# Patient Record
Sex: Female | Born: 1960 | Race: White | Hispanic: No | Marital: Married | Smoking: Never smoker
Health system: Southern US, Community
[De-identification: ages and names within clinical notes are randomized; demographics above are authoritative.]

## PROBLEM LIST (undated history)

## (undated) DIAGNOSIS — F329 Major depressive disorder, single episode, unspecified: Secondary | ICD-10-CM

## (undated) DIAGNOSIS — F32A Depression, unspecified: Secondary | ICD-10-CM

## (undated) HISTORY — PX: ABDOMINAL HYSTERECTOMY: SHX81

## (undated) HISTORY — PX: OTHER SURGICAL HISTORY: SHX169

## (undated) HISTORY — PX: BUNIONECTOMY: SHX129

## (undated) HISTORY — PX: BREAST REDUCTION SURGERY: SHX8

---

## 2011-02-21 ENCOUNTER — Emergency Department
Admit: 2011-02-21 | Discharge: 2011-02-21 | Disposition: A | Discharge status: Home / Self Care | Payer: Managed Care, Other (non HMO)

## 2011-02-21 ENCOUNTER — Emergency Department
Admission: EM | Admit: 2011-02-21 | Discharge: 2011-02-21 | Disposition: A | Discharge status: Home / Self Care | Payer: BC Managed Care – PPO | Source: Home / Self Care | Attending: Family Medicine | Admitting: Family Medicine

## 2011-02-21 ENCOUNTER — Encounter: Payer: Self-pay | Admitting: Emergency Medicine

## 2011-02-21 DIAGNOSIS — M79609 Pain in unspecified limb: Secondary | ICD-10-CM

## 2011-02-21 DIAGNOSIS — M79642 Pain in left hand: Secondary | ICD-10-CM

## 2011-02-21 DIAGNOSIS — M654 Radial styloid tenosynovitis [de Quervain]: Secondary | ICD-10-CM

## 2011-02-21 HISTORY — DX: Major depressive disorder, single episode, unspecified: F32.9

## 2011-02-21 HISTORY — DX: Depression, unspecified: F32.A

## 2011-02-21 MED ORDER — HYDROCODONE-ACETAMINOPHEN 5-500 MG PO TABS: 1.0000 | ORAL_TABLET | Freq: Every evening | ORAL | Status: AC | PRN

## 2011-02-21 NOTE — ED Notes (Signed)
Pain and swelling in left wrist x 4 days; saw Chiropractor that evening who did treatments and recommended exercises and ice.  Pt has computer related job; repetitive motion.

## 2011-02-24 NOTE — ED Provider Notes (Signed)
History     CSN: [id number] Arrival date & time: 02/21/2011  1:54 PM   First MD Initiated Contact with Patient 02/21/11 1417      Chief Complaint  Patient presents with  . Joint Swelling     HPI Comments: Patient believes that she "slept wrong" on her left hand/wrist 5 days ago, and has had persistent pain in the left wrist and thumb.  She underwent a chiropractic treatment without improvement.  She denies performing repetitive activities and no recent trauma to her left hand/wrist.  Patient is a 50 y.o. female presenting with wrist pain. The history is provided by the patient.  Wrist Pain This is a new problem. The current episode started more than 2 days ago. The problem occurs constantly. The problem has been gradually worsening. Exacerbated by: Moving wrist. The symptoms are relieved by NSAIDs. Treatments tried: Chiropractic. The treatment provided no relief.    Past Medical History  Diagnosis Date  . Depression   . Asthma     Past Surgical History  Procedure Date  . Caesarean   . Abdominal hysterectomy   . Breast reduction surgery     History reviewed. No pertinent family history.  History  Substance Use Topics  . Smoking status: No  . Smokeless tobacco: Not on file  . Alcohol Use: Yes    OB History    Grav Para Term Preterm Abortions TAB SAB Ect Mult Living                  Review of Systems  Constitutional: Negative.   HENT: Negative.   Eyes: Negative.   Respiratory: Negative.   Cardiovascular: Negative.   Gastrointestinal: Negative.   Genitourinary: Negative.   Musculoskeletal: Negative for joint swelling.       Pain in left wrist and thumb  Skin: Negative.   Neurological: Negative for numbness.    Allergies  Review of patient's allergies indicates no known allergies.  Home Medications   Current Outpatient Rx  Name Route Sig Dispense Refill  . ALBUTEROL SULFATE HFA 108 (90 BASE) MCG/ACT IN AERS Inhalation Inhale 2 puffs into the lungs  every 6 (six) hours as needed.      . BUPROPION HCL ER (SR) 150 MG PO TB12 Oral Take 150 mg by mouth 2 (two) times daily.      Marland Kitchen HYDROCODONE-ACETAMINOPHEN 5-500 MG PO TABS Oral Take 1 tablet by mouth at bedtime as needed for pain. 10 tablet 0    Pulse 75  Temp(Src) 97.6 F (36.4 C) (Oral)  Resp 16  Ht 5\' 1"  (1.549 m)  Wt 174 lb (78.926 kg)  BMI 32.88 kg/m2  SpO2 99%  Physical Exam  Constitutional: She is oriented to person, place, and time. She appears well-developed and well-nourished. No distress.  HENT:  Head: Normocephalic.  Nose: Nose normal.  Mouth/Throat: Oropharynx is clear and moist.  Eyes: Conjunctivae are normal. Pupils are equal, round, and reactive to light.  Musculoskeletal:       Left wrist: She exhibits normal range of motion, no tenderness, no bony tenderness, no swelling, no effusion, no crepitus and no deformity.       There is distinct tenderness over extensor tendons of left thumb, especially with resisted abduction of thumb.  Mild thumb swelling present.  Distal Neurovascular function is intact.   Neurological: She is alert and oriented to person, place, and time.  Skin: Skin is warm and dry.    ED Course  Procedures  none  Labs  Reviewed: *RADIOLOGY REPORT*  Clinical Data: Pain at base of thumb. No known injury.  LEFT HAND - COMPLETE 3+ VIEW  Comparison: None  Findings: Small degenerative subcortical cyst or geode noted  distally in the scaphoid. No malalignment or significant  degenerative arthropathy at the first carpometacarpal articulation  noted. No significant osseous abnormality is observed account the  patient's pain at the base of the thumb.  IMPRESSION:  1. A cause for the patient's thumb base pain is not observed. If  symptoms persist despite conservative therapy, MRI followup may be  warranted.  Original Report Authenticated By: Dellia Cloud, M.D.    1. De Quervain's tenosynovitis       MDM  Thumb spica splint applied;  wear for about one week. Apply ice pack for 30 to 45 minutes every 1 to 4 hours.  Continue until swelling decreases.  Begin Ibuprofen 200mg , 4 tabs every 8 hours with food.  Vicodin at bedtime for night-time pain Begin range of motion exercises in about 5 days ([facility] information and instruction handout given)  Followup with [facility] if not improving about two weeks.         Donna Christen, MD 02/24/11 [phone]

## 2011-09-05 ENCOUNTER — Emergency Department (INDEPENDENT_AMBULATORY_CARE_PROVIDER_SITE_OTHER)
Admission: EM | Admit: 2011-09-05 | Discharge: 2011-09-05 | Disposition: A | Discharge status: Home / Self Care | Payer: BC Managed Care – PPO | Source: Home / Self Care | Attending: Emergency Medicine | Admitting: Emergency Medicine

## 2011-09-05 ENCOUNTER — Ambulatory Visit: Payer: BC Managed Care – PPO | Attending: Emergency Medicine | Admitting: Physical Therapy

## 2011-09-05 ENCOUNTER — Emergency Department
Admit: 2011-09-05 | Discharge: 2011-09-05 | Disposition: A | Discharge status: Home / Self Care | Payer: BC Managed Care – PPO

## 2011-09-05 ENCOUNTER — Encounter: Payer: Self-pay | Admitting: *Deleted

## 2011-09-05 DIAGNOSIS — IMO0001 Reserved for inherently not codable concepts without codable children: Secondary | ICD-10-CM | POA: Insufficient documentation

## 2011-09-05 DIAGNOSIS — M545 Low back pain, unspecified: Secondary | ICD-10-CM | POA: Insufficient documentation

## 2011-09-05 DIAGNOSIS — M25559 Pain in unspecified hip: Secondary | ICD-10-CM

## 2011-09-05 MED ORDER — MELOXICAM 7.5 MG PO TABS: 7.5000 mg | ORAL_TABLET | Freq: Two times a day (BID) | ORAL | Status: AC | PRN

## 2011-09-05 NOTE — ED Provider Notes (Signed)
History     CSN: [id number]  Arrival date & time 09/05/11  4098   First MD Initiated Contact with Patient 09/05/11 [phone]      Chief Complaint  Patient presents with  . Hip Pain    (Consider location/radiation/quality/duration/timing/severity/associated sxs/prior treatment) HPI This is a 51 year old white female who complains of bilateral hip pain for the last 3-4 months.  She states that it is in her buttock and lower back area.  She has been to see a chiropractor who has not been x-rayed but has manually adjusted her which does help for a few days and then the pain returns.  She has an appointment with her PCP next week but does not want to wait until then.  She describes the pain as a 2/10 constant ache.  She has not been using any medications or modalities.  She has not been to a physical therapist or an orthopedist for this. Trauma: no Bladder/bowel incontinence: no Weakness: no Fever/chills: no Night pain: no Unexplained weight loss: no Cancer/immunosuppression: no PMH of osteoporosis or chronic steroid use:  no   Past Medical History  Diagnosis Date  . Depression   . Asthma     Past Surgical History  Procedure Date  . Caesarean   . Abdominal hysterectomy   . Breast reduction surgery     History reviewed. No pertinent family history.  History  Substance Use Topics  . Smoking status: Never Smoker   . Smokeless tobacco: Not on file  . Alcohol Use: Yes    OB History    Grav Para Term Preterm Abortions TAB SAB Ect Mult Living                  Review of Systems  All other systems reviewed and are negative.    Allergies  Review of patient's allergies indicates no known allergies.  Home Medications   Current Outpatient Rx  Name Route Sig Dispense Refill  . ALBUTEROL SULFATE HFA 108 (90 BASE) MCG/ACT IN AERS Inhalation Inhale 2 puffs into the lungs every 6 (six) hours as needed.      . BUPROPION HCL ER (SR) 150 MG PO TB12 Oral Take 150 mg by mouth 2 (two)  times daily.      . MELOXICAM 7.5 MG PO TABS Oral Take 1 tablet (7.5 mg total) by mouth 2 (two) times daily as needed for pain. 30 tablet 0    BP 135/83  Pulse 82  Resp 16  Ht 5' 0.5" (1.537 m)  Wt 171 lb (77.565 kg)  BMI 32.85 kg/m2  SpO2 98%  Physical Exam  Nursing note and vitals reviewed. Constitutional: She is oriented to person, place, and time. She appears well-developed and well-nourished.  HENT:  Head: Normocephalic and atraumatic.  Eyes: No scleral icterus.  Neck: Neck supple.  Cardiovascular: Regular rhythm and normal heart sounds.   Pulmonary/Chest: Effort normal and breath sounds normal. No respiratory distress.  Musculoskeletal:       Hip and examination demonstrates full range of motion which is pain free.  She is no anterior tenderness, no lateral tenderness.  She does have some tenderness over the bilateral SI joints and paraspinal lower lumbar.  Straight leg raise is negative bilaterally.  No rash or swelling is seen.  Neurological: She is alert and oriented to person, place, and time.  Skin: Skin is warm and dry.  Psychiatric: She has a normal mood and affect. Her speech is normal.    ED Course  Procedures (  including critical care time)  Labs Reviewed - No data to display Dg Lumbar Spine 2-3 Views  09/05/2011  *RADIOLOGY REPORT*  Clinical Data: Low back and SI joint pain for 3 months, radiculopathy on the left  LUMBAR SPINE - 2-3 VIEW  Comparison: None.  Findings: The lumbar vertebrae are in normal alignment.  Only the L5-L1 disc space is slightly narrowed.  However, there does appear to be sclerosis along the facet joints of L3-4, L4-5, and L5-S1 most consistent with facet joint arthropathy.  The SI joints appear corticated.  No compression deformity is seen.  IMPRESSION:  1.  Normal alignment with only minimally decreased disc space at L5- L1. 2.  Facet joint arthropathy at L3-4, L4-5, and L5-S1.  Original Report Authenticated By: Juline Patch, M.D.     1.  Pain in lower back       MDM   She appears to have SI joint dysfunction and pain.  Because she has not had an x-ray and because this has been going on for 3-4 months, I did obtain an x-ray of her lower back which is read by the radiologist as above.  We agreed that we will start her on meloxicam daily and send her to a physical therapist who can further evaluate and treat her pain.  If she is not improving, then a visit to an orthopedist or sports medicine physician would be appropriate at that time.     Marlaine Hind, MD 09/05/11 [phone]

## 2011-09-05 NOTE — ED Notes (Signed)
Patient c/o bilateral hip pain x 3-4 months. She has been going to a chiropractor and using heat which has helped. Recently it has become worse. She has an appointment with her PCP next week but would like some relief for the weekend.

## 2014-11-08 ENCOUNTER — Emergency Department (INDEPENDENT_AMBULATORY_CARE_PROVIDER_SITE_OTHER)
Admission: EM | Admit: 2014-11-08 | Discharge: 2014-11-08 | Disposition: A | Discharge status: Home / Self Care | Payer: BLUE CROSS/BLUE SHIELD | Source: Home / Self Care | Attending: Family Medicine | Admitting: Family Medicine

## 2014-11-08 ENCOUNTER — Emergency Department (INDEPENDENT_AMBULATORY_CARE_PROVIDER_SITE_OTHER): Payer: BLUE CROSS/BLUE SHIELD

## 2014-11-08 ENCOUNTER — Encounter: Payer: Self-pay | Admitting: Emergency Medicine

## 2014-11-08 DIAGNOSIS — M7150 Other bursitis, not elsewhere classified, unspecified site: Secondary | ICD-10-CM | POA: Diagnosis not present

## 2014-11-08 DIAGNOSIS — M7050 Other bursitis of knee, unspecified knee: Secondary | ICD-10-CM

## 2014-11-08 DIAGNOSIS — M1711 Unilateral primary osteoarthritis, right knee: Secondary | ICD-10-CM

## 2014-11-08 MED ORDER — MELOXICAM 15 MG PO TABS: 15.0000 mg | ORAL_TABLET | Freq: Every day | ORAL | Status: DC

## 2014-11-08 NOTE — ED Provider Notes (Signed)
CSN: [id number]     Arrival date & time 11/08/14  1324 History   First MD Initiated Contact with Patient 11/08/14 [phone]     Chief Complaint  Patient presents with  . Knee Pain      HPI Comments: Patient was moving and lifting/carrying boxes up and down stairs four days ago.  She subsequently developed pain in her right knee, and over the past two days has had swelling in her right knee and lower leg.  Patient is a 54 y.o. female presenting with knee pain. The history is provided by the patient.  Knee Pain Location:  Knee Time since incident:  3 days Injury: no   Knee location:  R knee Chronicity:  New Prior injury to area:  No Relieved by:  NSAIDs Worsened by:  Bearing weight and flexion Ineffective treatments:  None tried Associated symptoms: decreased ROM, stiffness and swelling   Associated symptoms: no fever, no muscle weakness, no numbness and no tingling   Risk factors: obesity     Past Medical History  Diagnosis Date  . Depression   . Asthma    Past Surgical History  Procedure Laterality Date  . Caesarean    . Abdominal hysterectomy    . Breast reduction surgery    . Bunionectomy     Family History  Problem Relation Age of Onset  . Diabetes Mother    Social History  Substance Use Topics  . Smoking status: Never Smoker   . Smokeless tobacco: None  . Alcohol Use: Yes   OB History    No data available     Review of Systems  Constitutional: Negative for fever.  Musculoskeletal: Positive for stiffness.  All other systems reviewed and are negative.   Allergies  Review of patient's allergies indicates not on file.  Home Medications   Prior to Admission medications   Medication Sig Start Date End Date Taking? Authorizing Provider  albuterol (PROVENTIL HFA;VENTOLIN HFA) 108 (90 BASE) MCG/ACT inhaler Inhale 2 puffs into the lungs every 6 (six) hours as needed.      Historical Provider, MD  buPROPion (WELLBUTRIN SR) 150 MG 12 hr tablet Take 150 mg by mouth  2 (two) times daily.      Historical Provider, MD  meloxicam (MOBIC) 15 MG tablet Take 1 tablet (15 mg total) by mouth daily. Take with food each morning 11/08/14   Lattie Haw, MD   BP 110/75 mmHg  Pulse 76  Ht 5' (1.524 m)  Wt 181 lb (82.101 kg)  BMI 35.35 kg/m2  SpO2 97% Physical Exam  Constitutional: She is oriented to person, place, and time. She appears well-developed and well-nourished. No distress.  Patient is obese (BMI 35.4)  HENT:  Head: Normocephalic.  Eyes: Pupils are equal, round, and reactive to light.  Pulmonary/Chest: No respiratory distress.  Musculoskeletal:       Right knee: She exhibits decreased range of motion and bony tenderness. She exhibits no swelling, no ecchymosis, no deformity, no erythema, normal alignment, no LCL laxity, normal patellar mobility, normal meniscus and no MCL laxity. Tenderness found. No medial joint line, no lateral joint line, no MCL, no LCL and no patellar tendon tenderness noted.       Legs: There is distinct tenderness to palpation over the pes anserine bursa.  McMurray test negative. Mild tenderness to palpation over posterior calf, without swelling or warmth.  Homan's test negative.  Neurological: She is alert and oriented to person, place, and time.  Skin: Skin  is warm and dry.  Nursing note and vitals reviewed.   ED Course  Procedures  none  Imaging Review Dg Knee Complete 4 Views Right  11/08/2014   CLINICAL DATA:  Right posterior knee pain for 3 days. Injury going up stairs.  EXAM: RIGHT KNEE - COMPLETE 4+ VIEW  COMPARISON:  None.  FINDINGS: No joint effusion identified. There is no fracture or subluxation identified. Patellofemoral and medial compartment marginal spur formation noted.  IMPRESSION: 1. Mild osteoarthritis. 2. No acute findings.   Electronically Signed   By: Signa Kell M.D.   On: 11/08/2014 09:28     MDM   1. Pes anserine bursitis     Hinged knee brace dispensed.  Rx for Mobic  daily. Apply ice  pack for 20 to 30 minutes, 3 to 4 times daily  Continue until pain decreases.  Wear knee brace and lower leg support hose daytime.  Elevate leg when possible.  Begin range of motion and stretching exercises. If symptoms become significantly worse during the night or over the weekend, proceed to the local emergency room.  Followup with Dr. Rodney Langton or Dr. Clementeen Graham ([facility]) if not improving about two weeks.     Lattie Haw, MD 11/08/14 [phone]

## 2014-11-08 NOTE — ED Notes (Signed)
Right knee pain, swelling and right ankle swelling x 4 days

## 2014-11-08 NOTE — Discharge Instructions (Signed)
Apply ice pack for 20 to 30 minutes, 3 to 4 times daily  Continue until pain decreases.  Wear knee brace and lower leg support hose daytime.  Elevate leg when possible.  Begin range of motion and stretching exercises.

## 2015-02-11 ENCOUNTER — Encounter: Payer: Self-pay | Admitting: Emergency Medicine

## 2015-02-11 ENCOUNTER — Emergency Department
Admission: EM | Admit: 2015-02-11 | Discharge: 2015-02-11 | Disposition: A | Discharge status: Home / Self Care | Payer: BLUE CROSS/BLUE SHIELD | Source: Home / Self Care | Attending: Family Medicine | Admitting: Family Medicine

## 2015-02-11 DIAGNOSIS — M25562 Pain in left knee: Secondary | ICD-10-CM | POA: Diagnosis not present

## 2015-02-11 DIAGNOSIS — M25561 Pain in right knee: Secondary | ICD-10-CM

## 2015-02-11 DIAGNOSIS — J029 Acute pharyngitis, unspecified: Secondary | ICD-10-CM

## 2015-02-11 MED ORDER — PREDNISONE 20 MG PO TABS: 20.0000 mg | ORAL_TABLET | Freq: Two times a day (BID) | ORAL | Status: DC

## 2015-02-11 MED ORDER — DOXYCYCLINE HYCLATE 100 MG PO CAPS: 100.0000 mg | ORAL_CAPSULE | Freq: Two times a day (BID) | ORAL | Status: DC

## 2015-02-11 MED ORDER — BENZONATATE 200 MG PO CAPS: 200.0000 mg | ORAL_CAPSULE | Freq: Every day | ORAL | Status: DC

## 2015-02-11 NOTE — Discharge Instructions (Signed)
May switch to Mobic after finishing prednisone

## 2015-02-11 NOTE — ED Notes (Signed)
Noticed soreness on back of right side of throat since yesterday; feels like "blockage".

## 2015-02-11 NOTE — ED Provider Notes (Signed)
CSN: [id number]     Arrival date & time 02/11/15  1103 History   First MD Initiated Contact with Patient 02/11/15 1134     Chief Complaint  Patient presents with  . Cough  . Nasal Congestion  . Hoarse      HPI Comments: Patient presents with two complaints: 1)  Yesterday while eating lunch she noticed soreness in her throat when swallowing, worse this am. 2)  She has developed increasing pain in both anterior knees, worse when bending, and climbing stairs.  Knees do not swell, give way, or lock.  No injury, and no recent change in activities.  The history is provided by the patient.    Past Medical History  Diagnosis Date  . Depression   . Asthma    Past Surgical History  Procedure Laterality Date  . Caesarean    . Abdominal hysterectomy    . Breast reduction surgery    . Bunionectomy     Family History  Problem Relation Age of Onset  . Diabetes Mother    Social History  Substance Use Topics  . Smoking status: Never Smoker   . Smokeless tobacco: None  . Alcohol Use: Yes   OB History    No data available     Review of Systems + sore throat No cough No pleuritic pain No wheezing No nasal congestion ? post-nasal drainage No sinus pain/pressure No itchy/red eyes No earache No hemoptysis No SOB No fever/chills No nausea No vomiting No abdominal pain No diarrhea No urinary symptoms No skin rash No fatigue No myalgias + knee pain No headache Used OTC meds without relief  Allergies  Review of patient's allergies indicates no known allergies.  Home Medications   Prior to Admission medications   Medication Sig Start Date End Date Taking? Authorizing Provider  albuterol (PROVENTIL HFA;VENTOLIN HFA) 108 (90 BASE) MCG/ACT inhaler Inhale 2 puffs into the lungs every 6 (six) hours as needed.      Historical Provider, MD  benzonatate (TESSALON) 200 MG capsule Take 1 capsule (200 mg total) by mouth at bedtime. Take as needed for cough 02/11/15   Lattie Haw, MD  buPROPion [facility] SR) 150 MG 12 hr tablet Take 150 mg by mouth 2 (two) times daily.      Historical Provider, MD  doxycycline (VIBRAMYCIN) 100 MG capsule Take 1 capsule (100 mg total) by mouth 2 (two) times daily. Take with food. 02/11/15   Lattie Haw, MD  meloxicam (MOBIC) 15 MG tablet Take 1 tablet (15 mg total) by mouth daily. Take with food each morning 11/08/14   Lattie Haw, MD  predniSONE (DELTASONE) 20 MG tablet Take 1 tablet (20 mg total) by mouth 2 (two) times daily. Take with food. 02/11/15   Lattie Haw, MD   Meds Ordered and Administered this Visit  Medications - No data to display  BP 113/72 mmHg  Pulse 88  Temp(Src) 99.3 F (37.4 C) (Oral)  Resp 16  Ht  (1.676 m)  Wt 170 lb (77.111 kg)  BMI 27.45 kg/m2  SpO2 96% No data found.   Physical Exam Nursing notes and Vital Signs reviewed. Appearance:  Patient appears stated age, and in no acute distress Eyes:  Pupils are equal, round, and reactive to light and accomodation.  Extraocular movement is intact.  Conjunctivae are not inflamed  Ears:  Canals normal.  Tympanic membranes normal.  Nose:   Normal turbinates.  No sinus tenderness.   Pharynx:  Normal Neck:  Supple.  Mildly enlarged and tender posterior nodes bilaterally Lungs:  Clear to auscultation.  Breath sounds are equal.  Moving air well. Heart:  Regular rate and rhythm without murmurs, rubs, or gallops.  Abdomen:  Nontender without masses or hepatosplenomegaly.  Bowel sounds are present.  No CVA or flank tenderness.  Extremities:   Bilateral knees :  No effusion, erythema, or warmth.  Knees stable, negative drawer test.  McMurray test negative.  Mild tenderness to palpation over patellae with extension Skin:  No rash present.   ED Course  Procedures  None   MDM   Bilateral Knee Pain; suspect patellofemoral pain Pharyngitis; suspect early viral URI  Begin prednisone burst. May switch to Mobic after finishing  prednisone followup with ENT if throat pain not improved 4 to 5 days. Followup with Dr. Rodney Langtonhomas Thekkekandam or Dr. Clementeen GrahamEvan Corey ([facility]) if not knees are not improving about two weeks.      Lattie HawStephen A Carlea Badour, MD 02/17/15 1057

## 2015-02-11 NOTE — ED Notes (Deleted)
Reports gradual worsening of congestion, cough that is green productive, and aches x 4 days. Did take ibuprofen today at 0800 when temp was 101 degrees. Did have Flu vaccination this season.

## 2015-02-11 NOTE — ED Notes (Signed)
Reports noticing soreness on right side of throat yesterday.

## 2016-04-18 ENCOUNTER — Encounter: Payer: Self-pay | Admitting: Emergency Medicine

## 2016-04-18 ENCOUNTER — Emergency Department
Admission: EM | Admit: 2016-04-18 | Discharge: 2016-04-18 | Disposition: A | Discharge status: Home / Self Care | Payer: BLUE CROSS/BLUE SHIELD | Source: Home / Self Care | Attending: Family Medicine | Admitting: Family Medicine

## 2016-04-18 DIAGNOSIS — J069 Acute upper respiratory infection, unspecified: Secondary | ICD-10-CM | POA: Diagnosis not present

## 2016-04-18 DIAGNOSIS — B9789 Other viral agents as the cause of diseases classified elsewhere: Secondary | ICD-10-CM | POA: Diagnosis not present

## 2016-04-18 MED ORDER — DEXAMETHASONE SODIUM PHOSPHATE 10 MG/ML IJ SOLN
10.0000 mg | Freq: Once | INTRAMUSCULAR | Status: AC
Start: 2016-04-18 — End: 2016-04-18
  Administered 2016-04-18: 10 mg via INTRAMUSCULAR

## 2016-04-18 MED ORDER — BENZONATATE 100 MG PO CAPS: 100.0000 mg | ORAL_CAPSULE | Freq: Three times a day (TID) | ORAL | 0 refills | Status: DC

## 2016-04-18 MED ORDER — PREDNISONE 20 MG PO TABS: ORAL_TABLET | ORAL | 0 refills | Status: DC

## 2016-04-18 NOTE — ED Provider Notes (Signed)
CSN: [id number]     Arrival date & time 04/18/16  0815 History   First MD Initiated Contact with Patient 04/18/16 [phone]     Chief Complaint  Patient presents with  . Cough  . Nasal Congestion   (Consider location/radiation/quality/duration/timing/severity/associated sxs/prior Treatment) HPI  Rebekah Hodges is a 56 y.o. female presenting to UC with c/o 5 days of progressively worsening congestion and cough. Hx of asthma. She has needed to use her inhaler every 4-6 hours.  Last used around 7:30AM this morning.  She has also tried OTC mucinex with mild relief. Denies fever, chills, n/v/d. No hx of sick contacts or recent travel.    Past Medical History:  Diagnosis Date  . Asthma   . Depression    Past Surgical History:  Procedure Laterality Date  . ABDOMINAL HYSTERECTOMY    . BREAST REDUCTION SURGERY    . BUNIONECTOMY    . caesarean     Family History  Problem Relation Age of Onset  . Diabetes Mother    Social History  Substance Use Topics  . Smoking status: Never Smoker  . Smokeless tobacco: Never Used  . Alcohol use Yes   OB History    No data available     Review of Systems  Constitutional: Negative for chills and fever.  HENT: Positive for congestion and ear pain ( bilateral fullness). Negative for sore throat, trouble swallowing and voice change.   Respiratory: Positive for cough. Negative for shortness of breath.   Cardiovascular: Negative for chest pain and palpitations.  Gastrointestinal: Negative for abdominal pain, diarrhea, nausea and vomiting.  Musculoskeletal: Negative for arthralgias, back pain and myalgias.  Skin: Negative for rash.    Allergies  Patient has no known allergies.  Home Medications   Prior to Admission medications   Medication Sig Start Date End Date Taking? Authorizing Provider  albuterol (PROVENTIL HFA;VENTOLIN HFA) 108 (90 BASE) MCG/ACT inhaler Inhale 2 puffs into the lungs every 6 (six) hours as needed.      Historical Provider, MD   benzonatate (TESSALON) 100 MG capsule Take 1-2 capsules (100-200 mg total) by mouth every 8 (eight) hours. 04/18/16   Junius Finner, PA-C  buPROPion (WELLBUTRIN SR) 150 MG 12 hr tablet Take 150 mg by mouth 2 (two) times daily.      Historical Provider, MD  meloxicam (MOBIC) 15 MG tablet Take 1 tablet (15 mg total) by mouth daily. Take with food each morning 11/08/14   Lattie Haw, MD  predniSONE (DELTASONE) 20 MG tablet Take 1 tablet (20 mg total) by mouth 2 (two) times daily. Take with food. 02/11/15   Lattie Haw, MD  predniSONE (DELTASONE) 20 MG tablet 3 tabs po day one, then 2 po daily x 4 days 04/18/16   Junius Finner, PA-C   Meds Ordered and Administered this Visit   Medications  dexamethasone (DECADRON) injection 10 mg (10 mg Intramuscular Given 04/18/16 0856)    BP 110/69 (BP Location: Left Arm)   Pulse 85   Temp 98.1 F (36.7 C) (Oral)   Resp 16   Ht 5' (1.524 m)   Wt 175 lb (79.4 kg)   SpO2 96%   BMI 34.18 kg/m  No data found.   Physical Exam  Constitutional: She is oriented to person, place, and time. She appears well-developed and well-nourished. No distress.  HENT:  Head: Normocephalic and atraumatic.  Right Ear: Tympanic membrane normal.  Left Ear: Tympanic membrane normal.  Nose: Nose normal.  Mouth/Throat: Uvula is  midline, oropharynx is clear and moist and mucous membranes are normal.  Eyes: EOM are normal.  Neck: Normal range of motion. Neck supple.  Cardiovascular: Normal rate and regular rhythm.   Pulmonary/Chest: Effort normal and breath sounds normal. No stridor. No respiratory distress. She has no wheezes. She has no rales.  Intermittent dry cough on exam. No respiratory distress. Lungs: CTAB  Musculoskeletal: Normal range of motion.  Lymphadenopathy:    She has no cervical adenopathy.  Neurological: She is alert and oriented to person, place, and time.  Skin: Skin is warm and dry. She is not diaphoretic.  Psychiatric: She has a normal mood and  affect. Her behavior is normal.  Nursing note and vitals reviewed.   Urgent Care Course     Procedures (including critical care time)  Labs Review Labs Reviewed - No data to display  Imaging Review No results found.  MDM   1. Viral URI with cough    Pt c/o 5 days of URI symptoms with cough and congestion. No evidence of underlying bacterial infection at this time.  Symptoms likely viral. Encouraged symptomatic treatment. Pt notes she does well with steroid shots for her asthma.    Tx in UC: Decadron 10mg  IM Rx: Prednisone for 5 days, and tessalon  Offered prescription to hold for azithromycin or amoxicillin, pt notes neither have worked in the past.  Encouraged to try symptomatic treatment, f/u with PCP in 1 week if not improving, fever develops or cough worsening. Patient verbalized understanding and agreement with treatment plan.     Junius Finnerrin O'Malley, PA-C 04/18/16 0913    Junius FinnerErin O'Malley, PA-C 04/18/16 1419

## 2016-04-18 NOTE — ED Triage Notes (Signed)
Patient reports 5 days of progressive worsening of congestion and cough; denies fever at any time.

## 2016-04-18 NOTE — Discharge Instructions (Signed)
°  You were given a shot of decadron (a steroid) today to help with inflammation in your airways to help reduce cough.  You have been prescribed 5 days of prednisone, an oral steroid.  You may start this medication tomorrow with breakfast.    You may take 400-600mg  Ibuprofen (Motrin) every 6-8 hours for fever and pain  Alternate with Tylenol  You may take 500mg  Tylenol every 4-6 hours as needed for fever and pain  You may continue to take over the counter mucinex. Be sure to take with a large glass of water to help stay hydrated and help keep mucous then to make it easier to cough up. Follow-up with your primary care provider next week for recheck of symptoms if not improving.  Be sure to drink plenty of fluids and rest, at least 8hrs of sleep a night, preferably more while you are sick. Return urgent care or go to closest ER if you cannot keep down fluids/signs of dehydration, fever not reducing with Tylenol, difficulty breathing/wheezing, stiff neck, worsening condition, or other concerns (see below)

## 2016-04-20 ENCOUNTER — Telehealth: Payer: Self-pay | Admitting: Emergency Medicine

## 2016-06-05 ENCOUNTER — Emergency Department
Admission: EM | Admit: 2016-06-05 | Discharge: 2016-06-05 | Disposition: A | Discharge status: Home / Self Care | Payer: BLUE CROSS/BLUE SHIELD | Source: Home / Self Care | Attending: Family Medicine | Admitting: Family Medicine

## 2016-06-05 DIAGNOSIS — R69 Illness, unspecified: Secondary | ICD-10-CM

## 2016-06-05 DIAGNOSIS — J111 Influenza due to unidentified influenza virus with other respiratory manifestations: Secondary | ICD-10-CM

## 2016-06-05 MED ORDER — GUAIFENESIN-CODEINE 100-10 MG/5ML PO SOLN: ORAL | 0 refills | Status: DC

## 2016-06-05 MED ORDER — OSELTAMIVIR PHOSPHATE 75 MG PO CAPS: 75.0000 mg | ORAL_CAPSULE | Freq: Two times a day (BID) | ORAL | 0 refills | Status: DC

## 2016-06-05 MED ORDER — PREDNISONE 20 MG PO TABS: ORAL_TABLET | ORAL | 0 refills | Status: DC

## 2016-06-05 MED ORDER — DEXAMETHASONE SODIUM PHOSPHATE 10 MG/ML IJ SOLN
10.0000 mg | Freq: Once | INTRAMUSCULAR | Status: AC
  Administered 2016-06-05: 10 mg via INTRAMUSCULAR

## 2016-06-05 NOTE — Discharge Instructions (Signed)
Begin prednisone Friday, 06/06/16. Take plain guaifenesin (1200mg  extended release tabs such as Mucinex) twice daily, with plenty of water, for cough and congestion.  May add Pseudoephedrine (30mg , one or two every 4 to 6 hours) for sinus congestion.  Get adequate rest.   May use Afrin nasal spray (or generic oxymetazoline) twice daily for about 5 days and then discontinue.  Also recommend using saline nasal spray several times daily and saline nasal irrigation (AYR is a common brand).  Use Flonase nasal spray each morning after using Afrin nasal spray and saline nasal irrigation. Try warm salt water gargles for sore throat.  Stop all antihistamines for now, and other non-prescription cough/cold preparations. Continue QVAR and albuterol inhalers. Follow-up with family doctor if not improving about 5 to 7 days.

## 2016-06-05 NOTE — ED Provider Notes (Signed)
[facility]    CSN: [id number] Arrival date & time: 06/05/16  0947     History   Chief Complaint Chief Complaint  Patient presents with  . Cough  . Fever  . Generalized Body Aches    HPI Rebekah Hodges is a 56 y.o. female.   Complains of 2 day history flu-like illness including myalgias, headache, chills, fatigue, and cough.  No sore throat.  Cough is non-productive and somewhat worse at night.  No pleuritic pain.  She has been using her albuterol inhaler more frequently because of increased wheezing and mild shortness of breath.  .    The history is provided by the patient.    Past Medical History:  Diagnosis Date  . Asthma   . Depression     Patient Active Problem List   Diagnosis Date Noted  . Hand pain, left 02/21/2011    Past Surgical History:  Procedure Laterality Date  . ABDOMINAL HYSTERECTOMY    . BREAST REDUCTION SURGERY    . BUNIONECTOMY    . caesarean      OB History    No data available       Home Medications    Prior to Admission medications   Medication Sig Start Date End Date Taking? Authorizing Provider  albuterol (PROVENTIL HFA;VENTOLIN HFA) 108 (90 BASE) MCG/ACT inhaler Inhale 2 puffs into the lungs every 6 (six) hours as needed.      Historical Provider, MD  benzonatate (TESSALON) 100 MG capsule Take 1-2 capsules (100-200 mg total) by mouth every 8 (eight) hours. 04/18/16   Junius Finner, PA-C  buPROPion (WELLBUTRIN SR) 150 MG 12 hr tablet Take 150 mg by mouth 2 (two) times daily.      Historical Provider, MD  guaiFENesin-codeine 100-10 MG/5ML syrup Take 10mL by mouth at bedtime as needed for cough 06/05/16   Lattie Haw, MD  meloxicam (MOBIC) 15 MG tablet Take 1 tablet (15 mg total) by mouth daily. Take with food each morning 11/08/14   Lattie Haw, MD  oseltamivir (TAMIFLU) 75 MG capsule Take 1 capsule (75 mg total) by mouth every 12 (twelve) hours. 06/05/16   Lattie Haw, MD  predniSONE (DELTASONE) 20 MG tablet Take  one tab by mouth twice daily for 5 days, then one daily for 3 days. Take with food. 06/05/16   Lattie Haw, MD    Family History Family History  Problem Relation Age of Onset  . Diabetes Mother     Social History Social History  Substance Use Topics  . Smoking status: Never Smoker  . Smokeless tobacco: Never Used  . Alcohol use Yes     Allergies   Patient has no known allergies.   Review of Systems Review of Systems  No sore throat + cough No pleuritic pain + wheezing + nasal congestion + post-nasal drainage No sinus pain/pressure No itchy/red eyes ? earache No hemoptysis + SOB No fever, + chills No nausea No vomiting No abdominal pain No diarrhea No urinary symptoms No skin rash + fatigue + myalgias + headache Used OTC meds without relief    Physical Exam Triage Vital Signs ED Triage Vitals  Enc Vitals Group     BP 06/05/16 1109 118/67     Pulse Rate 06/05/16 1109 80     Resp --      Temp 06/05/16 1109 98.2 F (36.8 C)     Temp Source 06/05/16 1109 Oral     SpO2 06/05/16 1109 94 %  Weight 06/05/16 1109 183 lb (83 kg)     Height 06/05/16 1109 5' (1.524 m)     Head Circumference --      Peak Flow --      Pain Score 06/05/16 1110 3     Pain Loc --      Pain Edu? --      Excl. in GC? --    No data found.   Updated Vital Signs BP 118/67 (BP Location: Left Arm)   Pulse 80   Temp 98.2 F (36.8 C) (Oral)   Ht 5' (1.524 m)   Wt 183 lb (83 kg)   SpO2 94%   BMI 35.74 kg/m   Visual Acuity Right Eye Distance:   Left Eye Distance:   Bilateral Distance:    Right Eye Near:   Left Eye Near:    Bilateral Near:     Physical Exam  Nursing notes and Vital Signs reviewed. Appearance:  Patient appears stated age, and in no acute distress Eyes:  Pupils are equal, round, and reactive to light and accomodation.  Extraocular movement is intact.  Conjunctivae are not inflamed  Ears:  Canals normal.  Tympanic membranes normal.  Nose:  Mildly  congested turbinates.  No sinus tenderness.   Pharynx:  Normal Neck:  Supple.  Tender enlarged posterior/lateral nodes are palpated bilaterally  Lungs:  Clear to auscultation.  Breath sounds are equal.  Moving air well. Heart:  Regular rate and rhythm without murmurs, rubs, or gallops.  Abdomen:  Nontender without masses or hepatosplenomegaly.  Bowel sounds are present.  No CVA or flank tenderness.  Extremities:  No edema.  Skin:  No rash present.    UC Treatments / Results  Labs (all labs ordered are listed, but only abnormal results are displayed) Labs Reviewed - No data to display  EKG  EKG Interpretation None       Radiology No results found.  Procedures Procedures (including critical care time)  Medications Ordered in UC Medications  dexamethasone (DECADRON) injection 10 mg (not administered)     Initial Impression / Assessment and Plan / UC Course  I have reviewed the triage vital signs and the nursing notes.  Pertinent labs & imaging results that were available during my care of the patient were reviewed by me and considered in my medical decision making (see chart for details).    Begin Tamiflu. Rx's given for prophylactic Tamiflu to two adult family memebers Administered Decadron 10mg  IM.  Begin prednisone burst/taper. Rx for Robitussin AC for night time cough.  Begin prednisone Friday, 06/06/16. Take plain guaifenesin (1200mg  extended release tabs such as Mucinex) twice daily, with plenty of water, for cough and congestion.  May add Pseudoephedrine (30mg , one or two every 4 to 6 hours) for sinus congestion.  Get adequate rest.   May use Afrin nasal spray (or generic oxymetazoline) twice daily for about 5 days and then discontinue.  Also recommend using saline nasal spray several times daily and saline nasal irrigation (AYR is a common brand).  Use Flonase nasal spray each morning after using Afrin nasal spray and saline nasal irrigation. Try warm salt water  gargles for sore throat.  Stop all antihistamines for now, and other non-prescription cough/cold preparations. Continue QVAR and albuterol inhalers. Follow-up with family doctor if not improving about 5 to 7 days.    Final Clinical Impressions(s) / UC Diagnoses   Final diagnoses:  Influenza-like illness    New Prescriptions New Prescriptions   GUAIFENESIN-CODEINE 100-10  MG/5ML SYRUP    Take 10mL by mouth at bedtime as needed for cough   OSELTAMIVIR (TAMIFLU) 75 MG CAPSULE    Take 1 capsule (75 mg total) by mouth every 12 (twelve) hours.   PREDNISONE (DELTASONE) 20 MG TABLET    Take one tab by mouth twice daily for 5 days, then one daily for 3 days. Take with food.     Lattie Haw, MD 06/12/16 2103

## 2016-06-05 NOTE — ED Triage Notes (Signed)
Pt started coughing Tuesday night.  Generalized body aches started yesterday.  Chills during the night last night.

## 2016-06-09 ENCOUNTER — Telehealth: Payer: Self-pay

## 2016-06-09 NOTE — Telephone Encounter (Signed)
Left message to follow up with UC or PCP if questions or concern.

## 2017-10-15 ENCOUNTER — Emergency Department
Admission: EM | Admit: 2017-10-15 | Discharge: 2017-10-15 | Disposition: A | Discharge status: Home / Self Care | Payer: BLUE CROSS/BLUE SHIELD | Source: Home / Self Care | Attending: Family Medicine | Admitting: Family Medicine

## 2017-10-15 ENCOUNTER — Encounter: Payer: Self-pay | Admitting: Emergency Medicine

## 2017-10-15 ENCOUNTER — Other Ambulatory Visit: Payer: Self-pay

## 2017-10-15 DIAGNOSIS — M25511 Pain in right shoulder: Secondary | ICD-10-CM | POA: Diagnosis not present

## 2017-10-15 MED ORDER — CYCLOBENZAPRINE HCL 5 MG PO TABS: 5.0000 mg | ORAL_TABLET | Freq: Two times a day (BID) | ORAL | 0 refills | Status: AC | PRN

## 2017-10-15 NOTE — Discharge Instructions (Signed)
°  You may take 500mg  acetaminophen (Tylenol) every 4-6 hours or in combination with ibuprofen 400-600mg  (Motrin or Advil) every 6-8 hours as needed for pain and inflammation.  Flexeril (cyclobenzaprine) is a muscle relaxer and may cause drowsiness. Do not drink alcohol, drive, or operate heavy machinery while taking.  Please follow up with family medicine or Sports Medicine in 1-2 weeks if not improving.

## 2017-10-15 NOTE — ED Provider Notes (Signed)
[facility]    CSN: [id number] Arrival date & time: 10/15/17  0830     History   Chief Complaint Chief Complaint  Patient presents with  . Shoulder Pain    HPI Rebekah Hodges is a 57 y.o. female.   HPI  Rebekah Hodges is a 57 y.o. female presenting to UC with c/o sudden onset Right shoulder pain that was 10/10 last night around midnight, waking her from her sleep. She took a hot shower, then applied ice and took 600mg  ibuprofen with mild relief. Pain is 6/10 now. Worse with certain movements. Pt states she did sleep in a different position last night but also reports a new fitness routine, which could have caused her pain. No specific known injury.    Past Medical History:  Diagnosis Date  . Asthma   . Depression     Patient Active Problem List   Diagnosis Date Noted  . Hand pain, left 02/21/2011    Past Surgical History:  Procedure Laterality Date  . ABDOMINAL HYSTERECTOMY    . BREAST REDUCTION SURGERY    . BUNIONECTOMY    . caesarean      OB History   None      Home Medications    Prior to Admission medications   Medication Sig Start Date End Date Taking? Authorizing Provider  albuterol (PROVENTIL HFA;VENTOLIN HFA) 108 (90 BASE) MCG/ACT inhaler Inhale 2 puffs into the lungs every 6 (six) hours as needed.      [provider]  buPROPion (WELLBUTRIN SR) 150 MG 12 hr tablet Take 150 mg by mouth 2 (two) times daily.      [provider]  cyclobenzaprine (FLEXERIL) 5 MG tablet Take 1-2 tablets (5-10 mg total) by mouth 2 (two) times daily as needed for muscle spasms. 10/15/17   Lurene ShadowPhelps, Nija Koopman O, PA-C    Family History Family History  Problem Relation Age of Onset  . Diabetes Mother     Social History Social History   Tobacco Use  . Smoking status: Never Smoker  . Smokeless tobacco: Never Used  Substance Use Topics  . Alcohol use: Yes  . Drug use: No     Allergies   Patient has no known allergies.   Review of  Systems Review of Systems  Musculoskeletal: Positive for arthralgias and myalgias.  Skin: Negative for color change and wound.  Neurological: Negative for weakness and numbness.     Physical Exam Triage Vital Signs ED Triage Vitals  Enc Vitals Group     BP 10/15/17 0852 (!) 145/88     Pulse Rate 10/15/17 0852 75     Resp --      Temp 10/15/17 0852 98.5 F (36.9 C)     Temp Source 10/15/17 0852 Oral     SpO2 10/15/17 0852 97 %     Weight 10/15/17 0853 174 lb (78.9 kg)     Height 10/15/17 0853 5' (1.524 m)     Head Circumference --      Peak Flow --      Pain Score 10/15/17 0852 6     Pain Loc --      Pain Edu? --      Excl. in GC? --    No data found.  Updated Vital Signs BP (!) 145/88 (BP Location: Left Arm)   Pulse 75   Temp 98.5 F (36.9 C) (Oral)   Ht 5' (1.524 m)   Wt 174 lb (78.9 kg)   SpO2 97%  BMI 33.98 kg/m   Visual Acuity Right Eye Distance:   Left Eye Distance:   Bilateral Distance:    Right Eye Near:   Left Eye Near:    Bilateral Near:     Physical Exam  Constitutional: She is oriented to person, place, and time. She appears well-developed and well-nourished.  HENT:  Head: Normocephalic and atraumatic.  Eyes: EOM are normal.  Neck: Normal range of motion.  Cardiovascular: Normal rate.  Pulses:      Radial pulses are 2+ on the right side.  Pulmonary/Chest: Effort normal.  Musculoskeletal: Normal range of motion. She exhibits no edema or tenderness.  Right shoulder: no deformity. Full ROM increased pain with adduction. No localized tenderness. Full ROM elbow, non-tender 5/5 grip strength bilaterally.    Neurological: She is alert and oriented to person, place, and time.  Skin: Skin is warm and dry. No rash noted. No erythema.  Psychiatric: She has a normal mood and affect. Her behavior is normal.  Nursing note and vitals reviewed.    UC Treatments / Results  Labs (all labs ordered are listed, but only abnormal results are  displayed) Labs Reviewed - No data to display  EKG None  Radiology No results found.  Procedures Procedures (including critical care time)  Medications Ordered in UC Medications - No data to display  Initial Impression / Assessment and Plan / UC Course  I have reviewed the triage vital signs and the nursing notes.  Pertinent labs & imaging results that were available during my care of the patient were reviewed by me and considered in my medical decision making (see chart for details).     Hx and exam c/w muscle strain. No imaging indicated at this time.   Final Clinical Impressions(s) / UC Diagnoses   Final diagnoses:  Acute pain of right shoulder     Discharge Instructions      You may take 500mg  acetaminophen (Tylenol) every 4-6 hours or in combination with ibuprofen 400-600mg  (Motrin or Advil) every 6-8 hours as needed for pain and inflammation.  Flexeril (cyclobenzaprine) is a muscle relaxer and may cause drowsiness. Do not drink alcohol, drive, or operate heavy machinery while taking.  Please follow up with family medicine or Sports Medicine in 1-2 weeks if not improving.    ED Prescriptions    Medication Sig Dispense Auth. Provider   cyclobenzaprine (FLEXERIL) 5 MG tablet Take 1-2 tablets (5-10 mg total) by mouth 2 (two) times daily as needed for muscle spasms. 20 tablet Lurene Shadow, PA-C     Controlled Substance Prescriptions [place] Controlled Substance Registry consulted? Not Applicable   Rolla Plate 10/15/17 1610

## 2017-10-15 NOTE — ED Triage Notes (Signed)
Right shoulder pain started at midnight, radiates down arm to wrist

## 2018-03-12 ENCOUNTER — Encounter: Payer: Self-pay | Admitting: Emergency Medicine

## 2018-03-12 ENCOUNTER — Emergency Department (INDEPENDENT_AMBULATORY_CARE_PROVIDER_SITE_OTHER)
Admission: EM | Admit: 2018-03-12 | Discharge: 2018-03-12 | Disposition: A | Discharge status: Home / Self Care | Payer: BLUE CROSS/BLUE SHIELD | Source: Home / Self Care | Attending: Family Medicine | Admitting: Family Medicine

## 2018-03-12 ENCOUNTER — Emergency Department (INDEPENDENT_AMBULATORY_CARE_PROVIDER_SITE_OTHER): Payer: BLUE CROSS/BLUE SHIELD

## 2018-03-12 ENCOUNTER — Other Ambulatory Visit: Payer: Self-pay

## 2018-03-12 DIAGNOSIS — M542 Cervicalgia: Secondary | ICD-10-CM

## 2018-03-12 DIAGNOSIS — M62838 Other muscle spasm: Secondary | ICD-10-CM

## 2018-03-12 DIAGNOSIS — M25512 Pain in left shoulder: Secondary | ICD-10-CM

## 2018-03-12 DIAGNOSIS — M25511 Pain in right shoulder: Secondary | ICD-10-CM

## 2018-03-12 DIAGNOSIS — M4722 Other spondylosis with radiculopathy, cervical region: Secondary | ICD-10-CM | POA: Diagnosis not present

## 2018-03-12 MED ORDER — MELOXICAM 7.5 MG PO TABS: 7.5000 mg | ORAL_TABLET | Freq: Every day | ORAL | 0 refills | Status: AC

## 2018-03-12 MED ORDER — METHOCARBAMOL 500 MG PO TABS: 500.0000 mg | ORAL_TABLET | Freq: Two times a day (BID) | ORAL | 0 refills | Status: AC

## 2018-03-12 NOTE — ED Triage Notes (Signed)
Patient reports pain and stiffness in right neck and shoulder for past 2 days; coincides with drive back from [place]. Took acetaminophen 500 mg at 0730.

## 2018-03-12 NOTE — ED Provider Notes (Signed)
[facility]    CSN: [id number] Arrival date & time: 03/12/18  0900     History   Chief Complaint Chief Complaint  Patient presents with  . Neck Pain  . Shoulder Pain    HPI Rebekah Hodges is a 57 y.o. female.   HPI  Rebekah Hodges is a 57 y.o. female presenting to UC with c/o neck and bilateral shoulder pain for about 3 days.  Pain is aching and sore, worse in Right shoulder, radiating into Right upper arm. She reports driving back and forth 11 hours to [place] on the same day the Saturday after Thanksgiving. Denies any other known injury or activity that could have triggered her pain. Hx of similar pain over the summer. Pt requesting imaging to r/o arthritis due to recurrent pain. She has taken Tylenol with mild relief. Denies numbness or weakness in arms or legs.    Past Medical History:  Diagnosis Date  . Asthma   . Depression   . Depression     Patient Active Problem List   Diagnosis Date Noted  . Hand pain, left 02/21/2011    Past Surgical History:  Procedure Laterality Date  . ABDOMINAL HYSTERECTOMY    . BREAST REDUCTION SURGERY    . BUNIONECTOMY    . caesarean      OB History   No obstetric history on file.      Home Medications    Prior to Admission medications   Medication Sig Start Date End Date Taking? Authorizing Provider  albuterol (PROVENTIL HFA;VENTOLIN HFA) 108 (90 BASE) MCG/ACT inhaler Inhale 2 puffs into the lungs every 6 (six) hours as needed.      [provider]  buPROPion (WELLBUTRIN SR) 150 MG 12 hr tablet Take 150 mg by mouth 2 (two) times daily.      [provider]  cyclobenzaprine (FLEXERIL) 5 MG tablet Take 1-2 tablets (5-10 mg total) by mouth 2 (two) times daily as needed for muscle spasms. 10/15/17   Lurene Shadow, PA-C  meloxicam (MOBIC) 7.5 MG tablet Take 1-2 tablets (7.5-15 mg total) by mouth daily. 03/12/18   Lurene Shadow, PA-C  methocarbamol (ROBAXIN) 500 MG tablet Take 1 tablet (500 mg  total) by mouth 2 (two) times daily. 03/12/18   Lurene Shadow, PA-C    Family History Family History  Problem Relation Age of Onset  . Diabetes Mother     Social History Social History   Tobacco Use  . Smoking status: Never Smoker  . Smokeless tobacco: Never Used  Substance Use Topics  . Alcohol use: Yes  . Drug use: No     Allergies   Patient has no known allergies.   Review of Systems Review of Systems  Musculoskeletal: Positive for arthralgias, back pain, myalgias and neck pain. Negative for neck stiffness.  Skin: Negative for color change and rash.  Neurological: Negative for weakness and numbness.     Physical Exam Triage Vital Signs ED Triage Vitals  Enc Vitals Group     BP 03/12/18 0918 127/79     Pulse Rate 03/12/18 0929 77     Resp --      Temp 03/12/18 0929 98.2 F (36.8 C)     Temp src --      SpO2 03/12/18 0929 96 %     Weight 03/12/18 0919 165 lb (74.8 kg)     Height 03/12/18 0919 5' (1.524 m)     Head Circumference --  Peak Flow --      Pain Score 03/12/18 0919 8     Pain Loc --      Pain Edu? --      Excl. in GC? --    No data found.  Updated Vital Signs BP 127/79 (BP Location: Right Arm)   Pulse 77   Temp 98.2 F (36.8 C)   Ht 5' (1.524 m)   Wt 165 lb (74.8 kg)   SpO2 96%   BMI 32.22 kg/m   Visual Acuity Right Eye Distance:   Left Eye Distance:   Bilateral Distance:    Right Eye Near:   Left Eye Near:    Bilateral Near:     Physical Exam Vitals signs and nursing note reviewed.  Constitutional:      Appearance: She is well-developed.  HENT:     Head: Normocephalic and atraumatic.  Neck:     Musculoskeletal: Normal range of motion and neck supple.  Cardiovascular:     Rate and Rhythm: Normal rate.     Pulses:          Radial pulses are 2+ on the right side and 2+ on the left side.  Pulmonary:     Effort: Pulmonary effort is normal. No respiratory distress.  Musculoskeletal: Normal range of motion.         General: Tenderness present.     Comments: No midline spinal tenderness.  Tenderness to upper trapezius bilaterally.  Mild tenderness to Right bicep. Full ROM upper and lower extremities bilaterally.   Skin:    General: Skin is warm and dry.     Capillary Refill: Capillary refill takes less than 2 seconds.  Neurological:     Mental Status: She is alert and oriented to person, place, and time.  Psychiatric:        Behavior: Behavior normal.      UC Treatments / Results  Labs (all labs ordered are listed, but only abnormal results are displayed) Labs Reviewed - No data to display  EKG None  Radiology Dg Cervical Spine Complete  Result Date: 03/12/2018 CLINICAL DATA:  Neck pain and shoulder pain, no known injury, initial encounter EXAM: CERVICAL SPINE - COMPLETE 4+ VIEW COMPARISON:  None. FINDINGS: Seven cervical segments are well visualized. Vertebral body height is well maintained. Minimal osteophytic changes are noted. Multilevel facet hypertrophic changes are seen with very minimal anterolisthesis of C6 on C7. Mild neural foraminal narrowing is noted left greater than right. No acute fracture or acute facet abnormality is noted. The odontoid is within normal limits. No prevertebral soft tissue abnormality is seen. IMPRESSION: Mild degenerative change as described. Electronically Signed   By: Alcide CleverMark  Lukens M.D.   On: 03/12/2018 09:48   Dg Shoulder Right  Result Date: 03/12/2018 CLINICAL DATA:  Shoulder pain and decreased range of motion, no known injury, initial encounter EXAM: RIGHT SHOULDER - 2+ VIEW COMPARISON:  None. FINDINGS: Degenerative changes of the acromioclavicular joint are seen. No acute fracture or dislocation is noted. No soft tissue abnormality is seen. IMPRESSION: Degenerative change without acute abnormality. Electronically Signed   By: Alcide CleverMark  Lukens M.D.   On: 03/12/2018 09:51   Dg Shoulder Left  Result Date: 03/12/2018 CLINICAL DATA:  Left shoulder pain and  decreased range of motion, no known injury, initial encounter EXAM: LEFT SHOULDER - 2+ VIEW COMPARISON:  None. FINDINGS: Mild degenerative change of the acromioclavicular joint is seen. No acute fracture or dislocation is noted. The underlying bony thorax is within  normal limits. No soft tissue abnormality is seen. IMPRESSION: Mild degenerative change without acute abnormality. Electronically Signed   By: Alcide Clever M.D.   On: 03/12/2018 09:51    Procedures Procedures (including critical care time)  Medications Ordered in UC Medications - No data to display  Initial Impression / Assessment and Plan / UC Course  I have reviewed the triage vital signs and the nursing notes.  Pertinent labs & imaging results that were available during my care of the patient were reviewed by me and considered in my medical decision making (see chart for details).     Pt requested imaging for arthritis due to recurrent shoulder and neck pain. Reviewed imaging with pt Encouraged f/u with PCP and Sports medicine.  Final Clinical Impressions(s) / UC Diagnoses   Final diagnoses:  Neck pain  Left shoulder pain  Acute pain of right shoulder  Trapezius muscle spasm  Osteoarthritis of spine with radiculopathy, cervical region     Discharge Instructions      Meloxicam (Mobic) is an antiinflammatory to help with pain and inflammation.  Do not take ibuprofen, Advil, Aleve, or any other medications that contain NSAIDs while taking meloxicam as this may cause stomach upset or even ulcers if taken in large amounts for an extended period of time.   You may take acetaminophen (Tylenol) with the meloxicam  Robaxin (methocarbamol) is a muscle relaxer and may cause drowsiness. Do not drink alcohol, drive, or operate heavy machinery while taking.  Please call to schedule an appointment with Sports medicine or your family medicine provider for ongoing healthcare needs and recheck of pain if not improving by next week  or for recurrent shoulder pain.     ED Prescriptions    Medication Sig Dispense Auth. Provider   methocarbamol (ROBAXIN) 500 MG tablet Take 1 tablet (500 mg total) by mouth 2 (two) times daily. 20 tablet Waylan Rocher O, PA-C   meloxicam (MOBIC) 7.5 MG tablet Take 1-2 tablets (7.5-15 mg total) by mouth daily. 30 tablet Lurene Shadow, PA-C     Controlled Substance Prescriptions [place] Controlled Substance Registry consulted? Not Applicable   Rolla Plate 03/12/18 1015

## 2018-03-12 NOTE — Discharge Instructions (Signed)
°  Meloxicam (Mobic) is an antiinflammatory to help with pain and inflammation.  Do not take ibuprofen, Advil, Aleve, or any other medications that contain NSAIDs while taking meloxicam as this may cause stomach upset or even ulcers if taken in large amounts for an extended period of time.   You may take acetaminophen (Tylenol) with the meloxicam  Robaxin (methocarbamol) is a muscle relaxer and may cause drowsiness. Do not drink alcohol, drive, or operate heavy machinery while taking.  Please call to schedule an appointment with Sports medicine or your family medicine provider for ongoing healthcare needs and recheck of pain if not improving by next week or for recurrent shoulder pain.

## 2019-07-01 IMAGING — DX DG CERVICAL SPINE COMPLETE 4+V
5 series · 5 of 5 positions shown · non-contrast
Comparison: None.

CLINICAL DATA: Neck pain and shoulder pain, no known injury,
initial encounter

EXAM:
CERVICAL SPINE - COMPLETE 4+ VIEW

[c-spine lat]
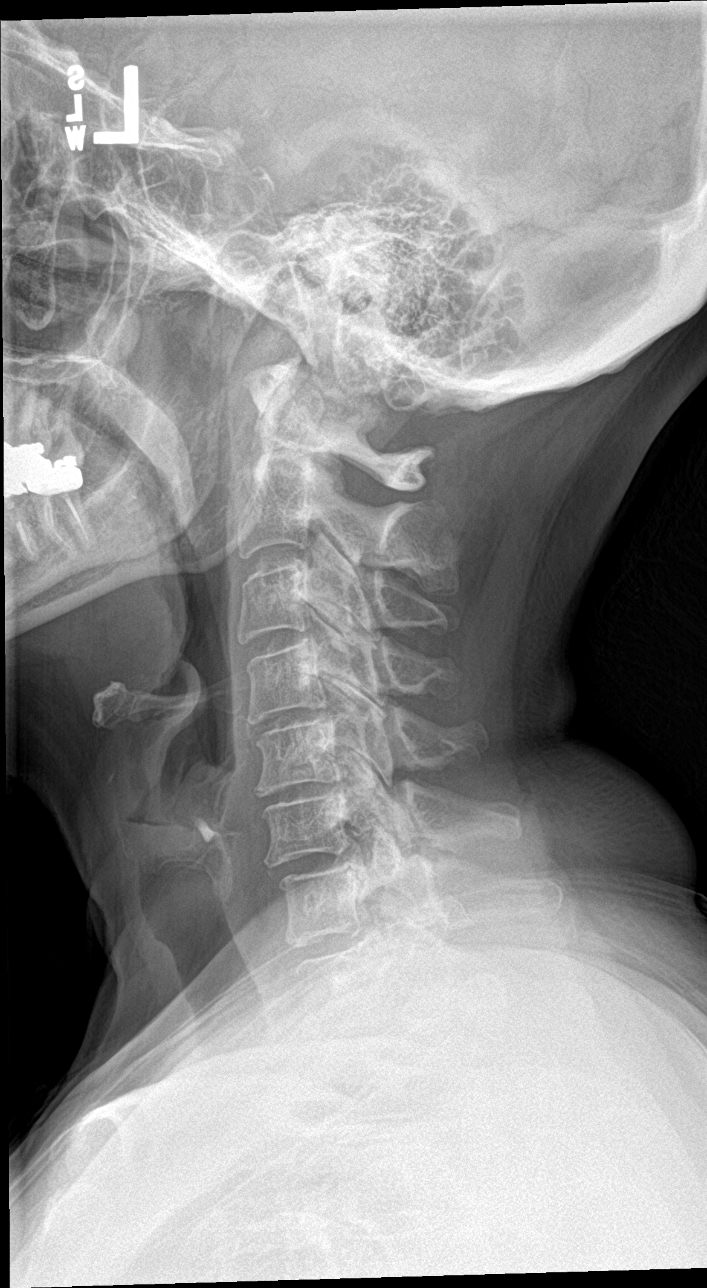

[c-spine obl (1 of 2)]
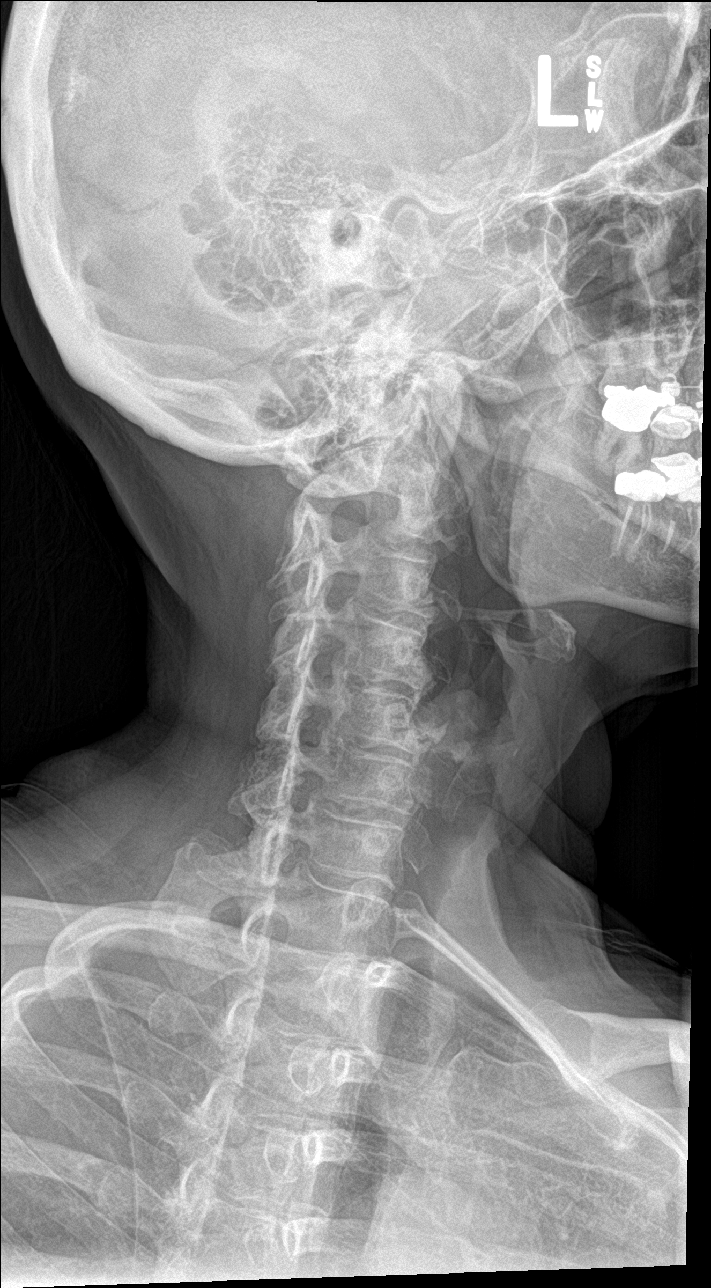

[c-spine obl (2 of 2)]
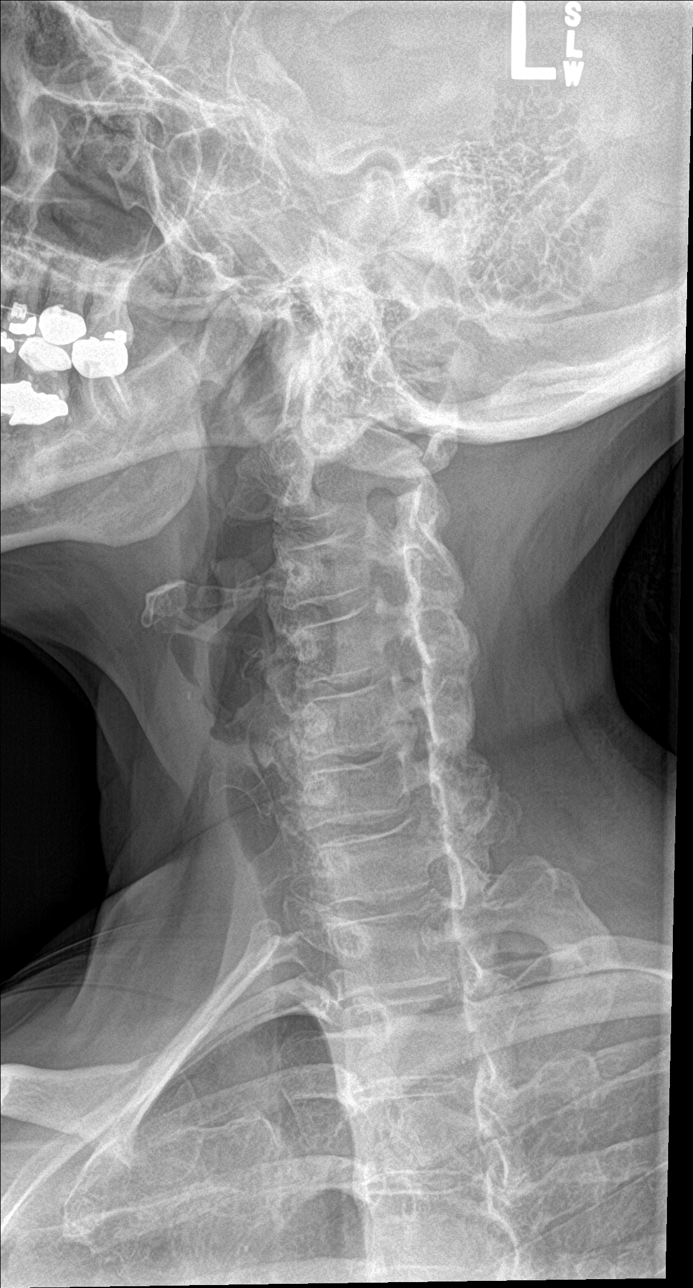

[c-spine ap]
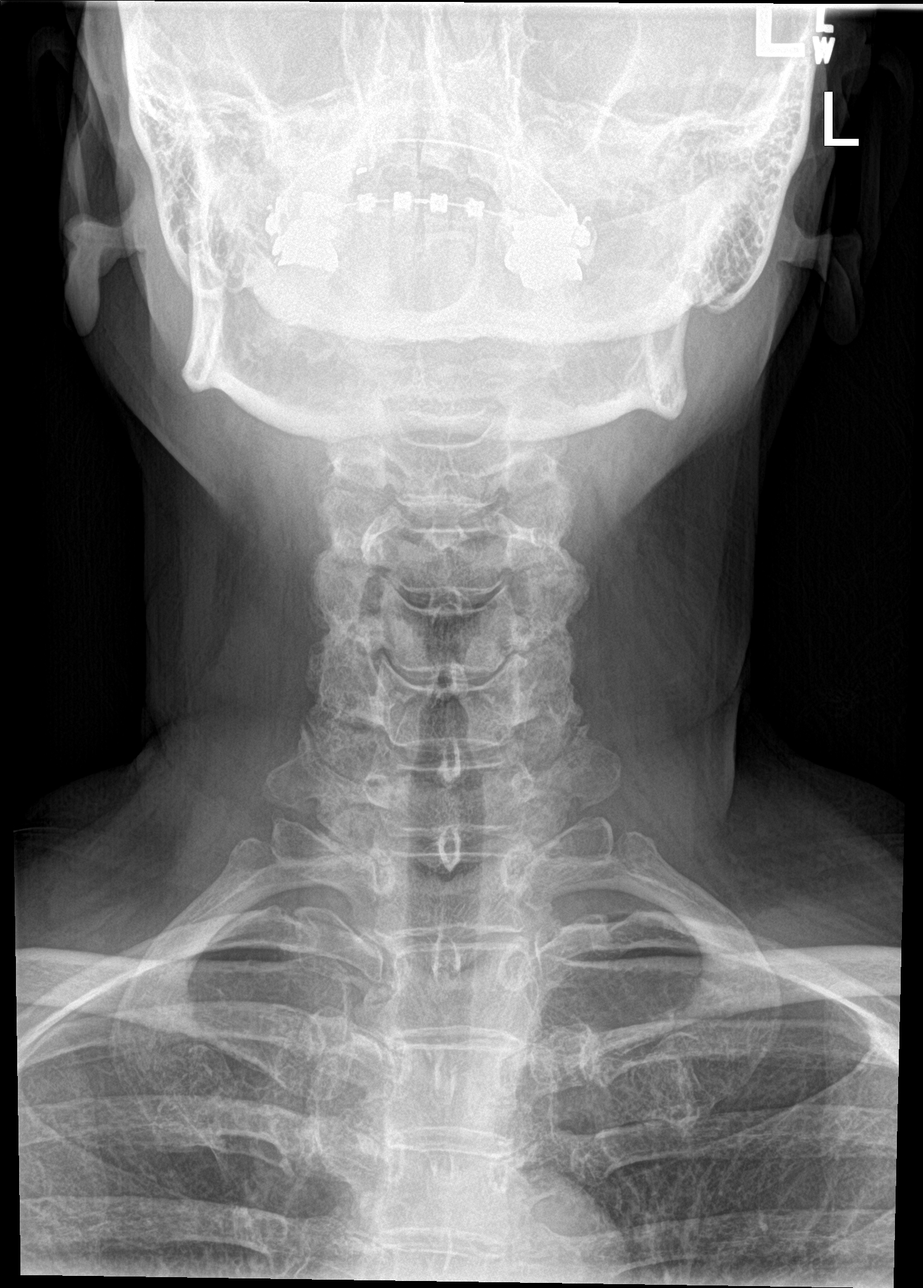

[c-spine open mouth]
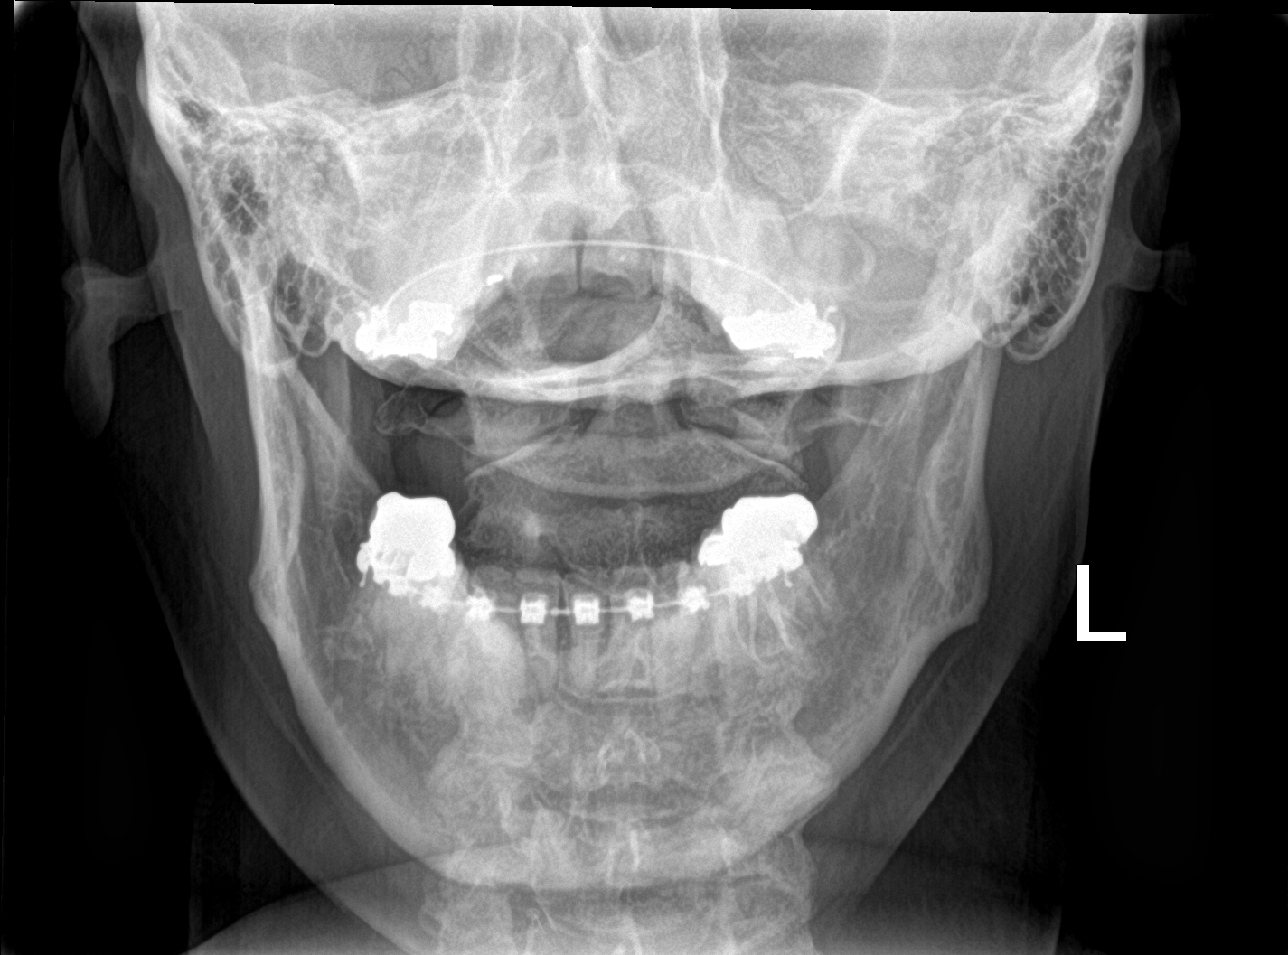

[5 of 5 positions shown; findings below may reference images not displayed]

FINDINGS: Seven cervical segments are well visualized. Vertebral body height
is well maintained. Minimal osteophytic changes are noted.
Multilevel facet hypertrophic changes are seen with very minimal
anterolisthesis of C6 on C7. Mild neural foraminal narrowing is
noted left greater than right. No acute fracture or acute facet
abnormality is noted. The odontoid is within normal limits. No
prevertebral soft tissue abnormality is seen.
IMPRESSION: Mild degenerative change as described.

## 2019-07-01 IMAGING — DX DG SHOULDER 2+V*L*
3 series · 3 of 3 positions shown · non-contrast
Comparison: None.

CLINICAL DATA: Left shoulder pain and decreased range of motion, no
known injury, initial encounter

EXAM:
LEFT SHOULDER - 2+ VIEW

[shoulder grashey]
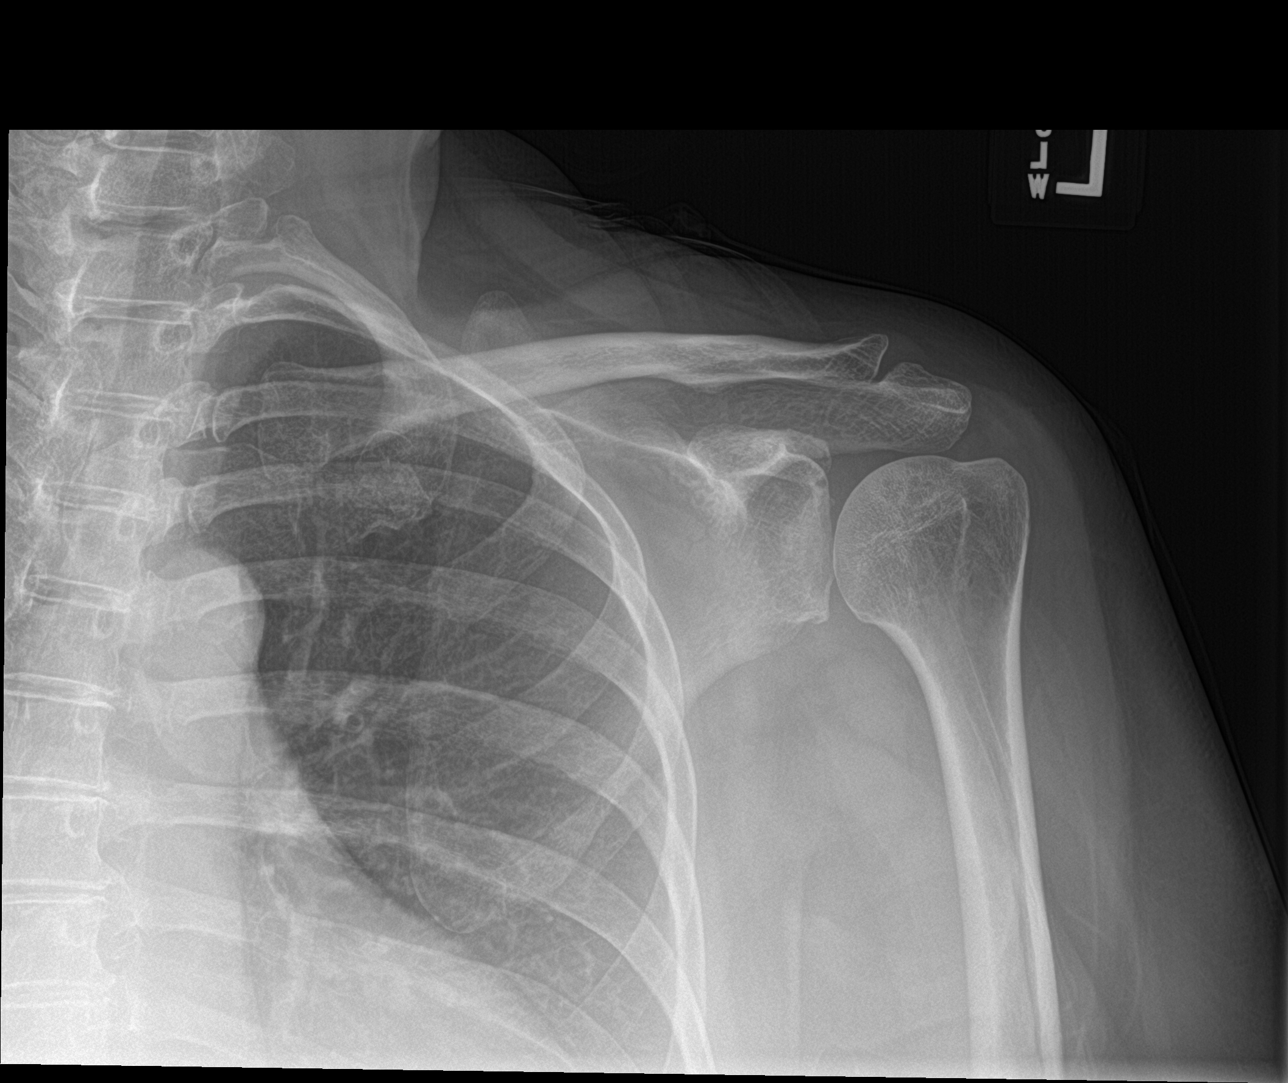

[shoulder y view]
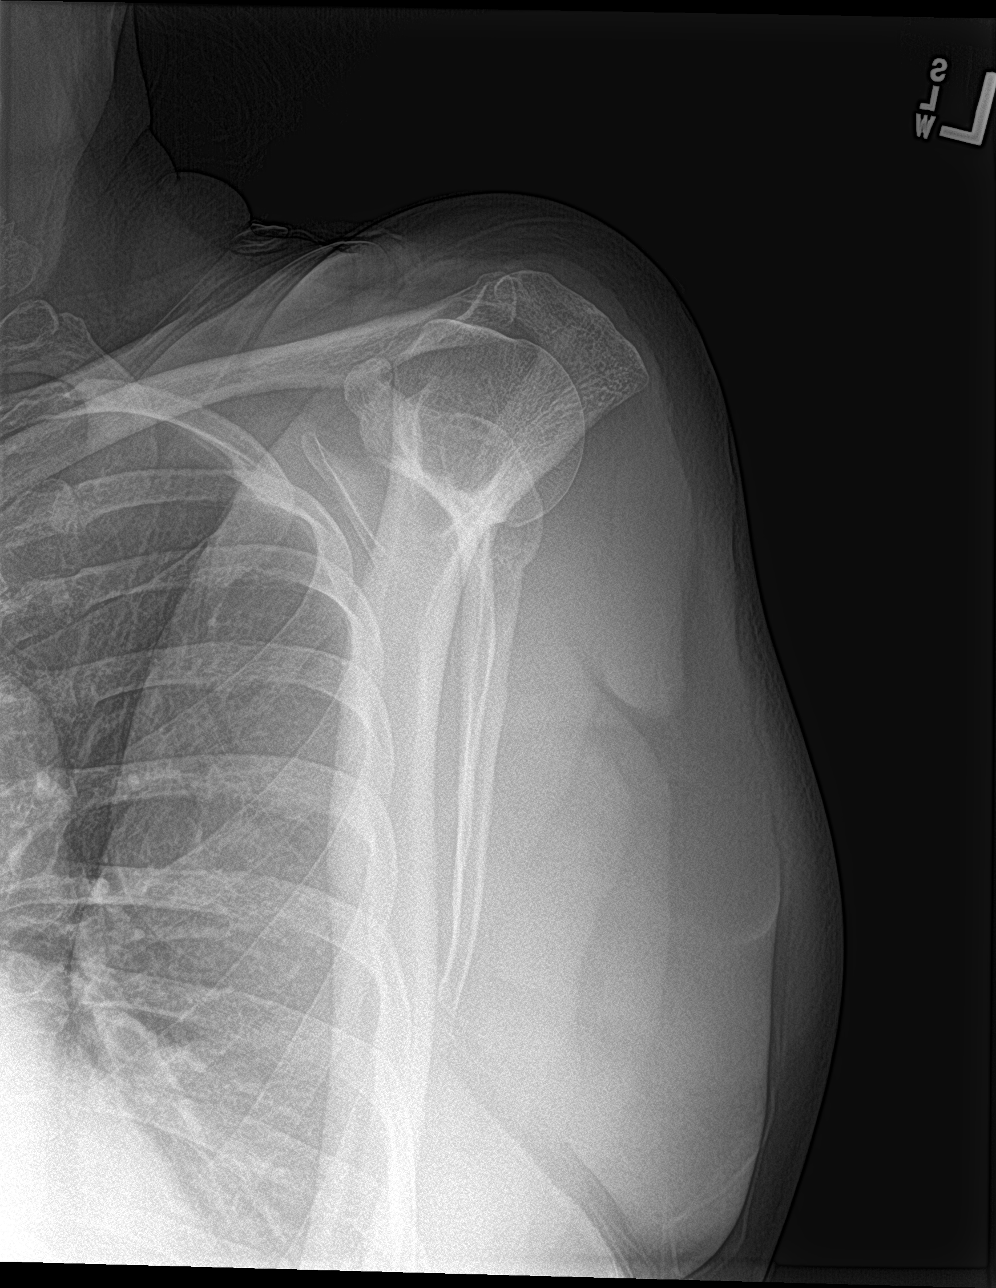

[shoulder ap neutral]
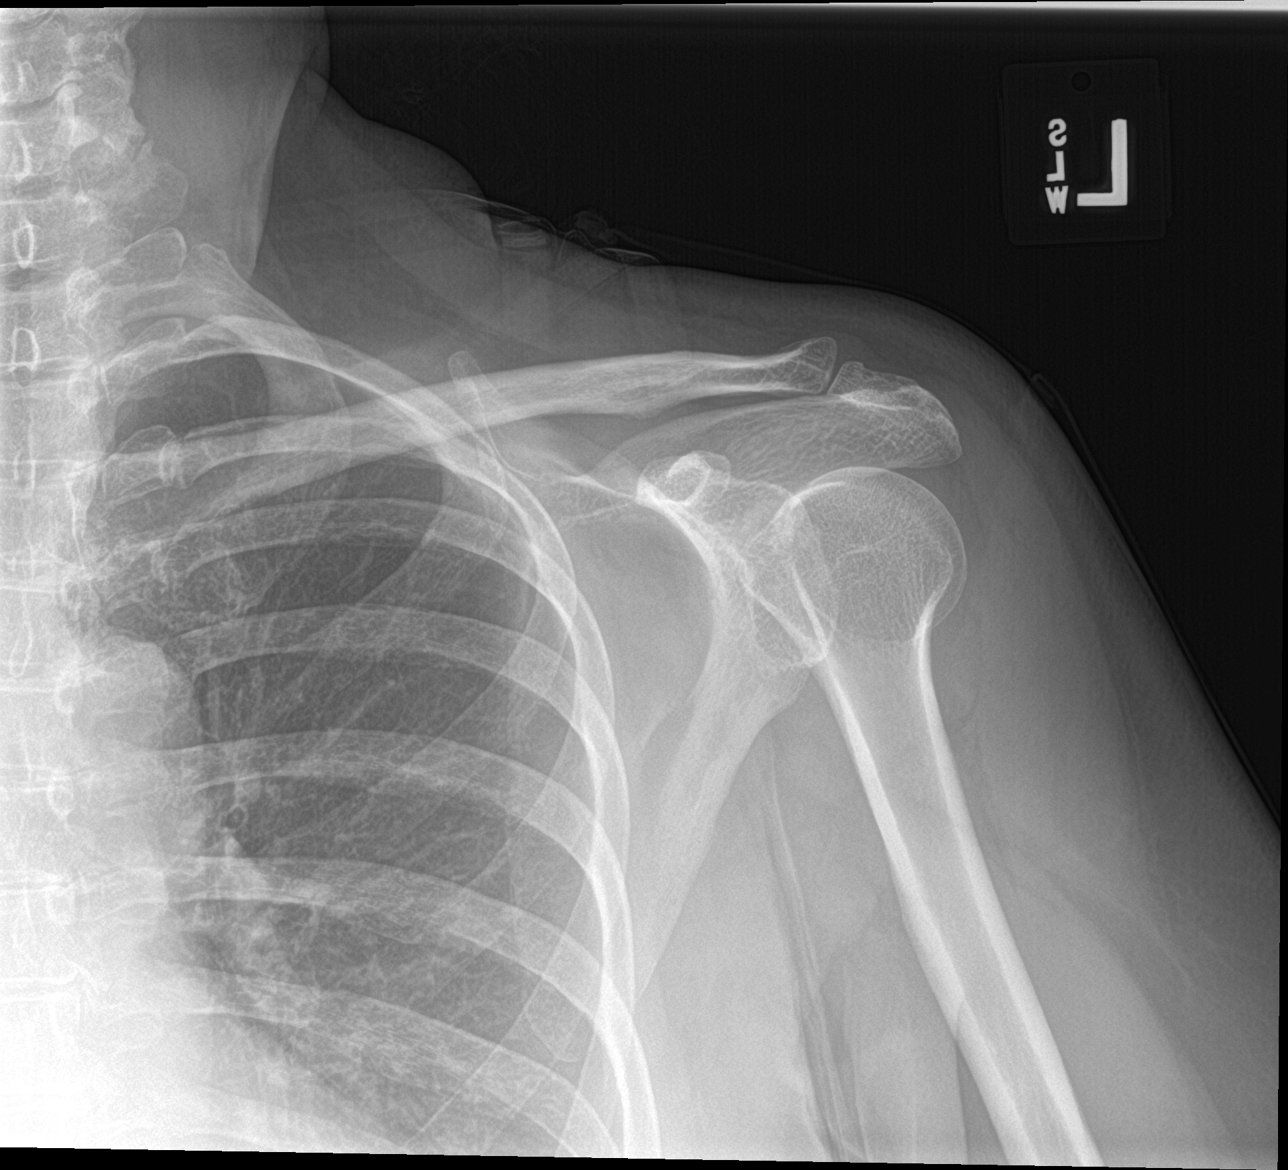

[3 of 3 positions shown; findings below may reference images not displayed]

FINDINGS: Mild degenerative change of the acromioclavicular joint is seen. No
acute fracture or dislocation is noted. The underlying bony thorax
is within normal limits. No soft tissue abnormality is seen.
IMPRESSION: Mild degenerative change without acute abnormality.
